# Patient Record
Sex: Male | Born: 2000 | Race: White | Hispanic: No | Marital: Single | Smoking: Current every day smoker
Health system: Southern US, Community
[De-identification: ages and names within clinical notes are randomized; demographics above are authoritative.]

## PROBLEM LIST (undated history)

## (undated) DIAGNOSIS — J45909 Unspecified asthma, uncomplicated: Secondary | ICD-10-CM

## (undated) DIAGNOSIS — F909 Attention-deficit hyperactivity disorder, unspecified type: Secondary | ICD-10-CM

---
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
## 2021-12-27 ENCOUNTER — Encounter (HOSPITAL_COMMUNITY): Payer: Self-pay | Admitting: Emergency Medicine

## 2021-12-27 ENCOUNTER — Other Ambulatory Visit: Payer: Self-pay

## 2021-12-27 ENCOUNTER — Emergency Department (HOSPITAL_COMMUNITY): Payer: Medicaid Other

## 2021-12-27 ENCOUNTER — Emergency Department (HOSPITAL_COMMUNITY)
Admission: EM | Admit: 2021-12-27 | Discharge: 2021-12-27 | Disposition: A | Discharge status: Home / Self Care | Payer: Medicaid Other | Attending: Emergency Medicine | Admitting: Emergency Medicine

## 2021-12-27 DIAGNOSIS — W503XXA Accidental bite by another person, initial encounter: Secondary | ICD-10-CM

## 2021-12-27 DIAGNOSIS — S61411A Laceration without foreign body of right hand, initial encounter: Secondary | ICD-10-CM | POA: Insufficient documentation

## 2021-12-27 DIAGNOSIS — S6991XA Unspecified injury of right wrist, hand and finger(s), initial encounter: Secondary | ICD-10-CM

## 2021-12-27 DIAGNOSIS — Z23 Encounter for immunization: Secondary | ICD-10-CM | POA: Insufficient documentation

## 2021-12-27 DIAGNOSIS — M79641 Pain in right hand: Secondary | ICD-10-CM | POA: Diagnosis present

## 2021-12-27 MED ORDER — TETANUS-DIPHTH-ACELL PERTUSSIS 5-2.5-18.5 LF-MCG/0.5 IM SUSY
0.5000 mL | PREFILLED_SYRINGE | Freq: Once | INTRAMUSCULAR | Status: AC
  Administered 2021-12-27: 0.5 mL via INTRAMUSCULAR
  Filled 2021-12-27: qty 0.5

## 2021-12-27 MED ORDER — AMOXICILLIN-POT CLAVULANATE 875-125 MG PO TABS
1.0000 | ORAL_TABLET | Freq: Once | ORAL | Status: AC
  Administered 2021-12-27: 1 via ORAL
  Filled 2021-12-27: qty 1

## 2021-12-27 NOTE — Discharge Instructions (Addendum)
You were seen in the emergency department today for your hand injury.  As we discussed your x-ray did not show any broken or dislocated bones.  I am placing you on antibiotics to prevent infection from the break in your skin on the knuckle.   Please use Tylenol or ibuprofen for pain.  You may use 800 mg ibuprofen every 6 hours or 1000 mg of Tylenol every 6 hours.  You may choose to alternate between the two, this would be most effective. Do not exceed 4000 mg of Tylenol within 24 hours.  Do not exceed 3200 mg ibuprofen within 24 hours.  Use ice as needed for swelling.  I've attached the contact information for an orthopedic doctor you can follow up with if your symptoms persist.   Continue to monitor how you're doing and return to the ER for new or worsening symptoms.
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
## 2022-06-02 ENCOUNTER — Encounter (HOSPITAL_COMMUNITY): Payer: Self-pay

## 2022-06-02 ENCOUNTER — Other Ambulatory Visit: Payer: Self-pay

## 2022-06-02 ENCOUNTER — Emergency Department (HOSPITAL_COMMUNITY): Payer: Self-pay

## 2022-06-02 ENCOUNTER — Emergency Department (HOSPITAL_COMMUNITY)
Admission: EM | Admit: 2022-06-02 | Discharge: 2022-06-02 | Disposition: A | Discharge status: Home / Self Care | Payer: Self-pay | Attending: Emergency Medicine | Admitting: Emergency Medicine

## 2022-06-02 DIAGNOSIS — K645 Perianal venous thrombosis: Secondary | ICD-10-CM | POA: Insufficient documentation

## 2022-06-02 DIAGNOSIS — R1030 Lower abdominal pain, unspecified: Secondary | ICD-10-CM | POA: Insufficient documentation

## 2022-06-02 DIAGNOSIS — R748 Abnormal levels of other serum enzymes: Secondary | ICD-10-CM | POA: Insufficient documentation

## 2022-06-02 DIAGNOSIS — G8929 Other chronic pain: Secondary | ICD-10-CM | POA: Insufficient documentation

## 2022-06-02 HISTORY — DX: Attention-deficit hyperactivity disorder, unspecified type: F90.9

## 2022-06-02 HISTORY — DX: Unspecified asthma, uncomplicated: J45.909

## 2022-06-02 LAB — COMPREHENSIVE METABOLIC PANEL
ALT: 32 U/L (ref 0–44)
AST: 24 U/L (ref 15–41)
Albumin: 4.6 g/dL (ref 3.5–5.0)
Alkaline Phosphatase: 60 U/L (ref 38–126)
Anion gap: 8 (ref 5–15)
BUN: 14 mg/dL (ref 6–20)
CO2: 27 mmol/L (ref 22–32)
Calcium: 9.3 mg/dL (ref 8.9–10.3)
Chloride: 104 mmol/L (ref 98–111)
Creatinine, Ser: 1.26 mg/dL — ABNORMAL HIGH (ref 0.61–1.24)
GFR, Estimated: >60 mL/min (ref >60)
Glucose, Bld: 91 mg/dL (ref 70–99)
Potassium: 4 mmol/L (ref 3.5–5.1)
Sodium: 139 mmol/L (ref 135–145)
Total Bilirubin: 0.9 mg/dL (ref 0.3–1.2)
Total Protein: 7.9 g/dL (ref 6.5–8.1)

## 2022-06-02 LAB — CBC WITH DIFFERENTIAL/PLATELET
Abs Immature Granulocytes: 0.01 10*3/uL (ref 0.00–0.07)
Basophils Absolute: 0.1 10*3/uL (ref 0.0–0.1)
Basophils Relative: 1 %
Eosinophils Absolute: 0.2 10*3/uL (ref 0.0–0.5)
Eosinophils Relative: 3 %
HCT: 46 % (ref 39.0–52.0)
Hemoglobin: 15.4 g/dL (ref 13.0–17.0)
Immature Granulocytes: 0 %
Lymphocytes Relative: 38 %
Lymphs Abs: 3.3 10*3/uL (ref 0.7–4.0)
MCH: 28.2 pg (ref 26.0–34.0)
MCHC: 33.5 g/dL (ref 30.0–36.0)
MCV: 84.1 fL (ref 80.0–100.0)
Monocytes Absolute: 0.5 10*3/uL (ref 0.1–1.0)
Monocytes Relative: 6 %
Neutro Abs: 4.6 10*3/uL (ref 1.7–7.7)
Neutrophils Relative %: 52 %
Platelets: 367 10*3/uL (ref 150–400)
RBC: 5.47 MIL/uL (ref 4.22–5.81)
RDW: 12.9 % (ref 11.5–15.5)
WBC: 8.7 10*3/uL (ref 4.0–10.5)
nRBC: 0 % (ref 0.0–0.2)

## 2022-06-02 LAB — LIPASE, BLOOD: Lipase: 55 U/L — ABNORMAL HIGH (ref 11–51)

## 2022-06-02 MED ORDER — HYDROCORTISONE ACETATE 25 MG RE SUPP: 25.0000 mg | Freq: Two times a day (BID) | RECTAL | 0 refills | Status: AC

## 2022-06-02 MED ORDER — IOHEXOL 300 MG/ML  SOLN
100.0000 mL | Freq: Once | INTRAMUSCULAR | Status: AC | PRN
  Administered 2022-06-02: 100 mL via INTRAVENOUS

## 2022-06-02 NOTE — ED Notes (Signed)
ED Provider at bedside. 

## 2022-06-02 NOTE — ED Triage Notes (Signed)
Pt arrived via POV c/o RUQ/RLQ pain for over a week. Pt endorses hemorrhoids, diarrhea, and notices blood in stool. Pt does report quitting ETOH X 3 months, does have recent episodes of emesis, mild GERD and reports taking OTC medication for heart burn. Pain is not reproducible to palpitation.

## 2022-06-02 NOTE — ED Provider Notes (Signed)
[place] EMERGENCY DEPARTMENT AT [facility] Provider Note   CSN: [id number] Arrival date & time: 06/02/22  0023     History  Chief Complaint  Patient presents with   Abdominal Pain    Jackson Horton is a 22 y.o. male.  The history is provided by the patient.  Abdominal Pain He has history of alcohol abuse and states that he did start drinking about 3 months ago.  When he stopped drinking, he noted that he was having pain in his right mid and lower abdomen.  Pain waxes and wanes but has in general been getting worse.  There is associated nausea and vomiting.  He states he does not know if he was in pain before he stopped drinking because he stayed drunk all the time.  He is also complaining of hemorrhoids which have been chronic but have been getting worse.   Home Medications Prior to Admission medications   Medication Sig Start Date End Date Taking? Authorizing Provider  albuterol (PROVENTIL HFA;VENTOLIN HFA) 108 (90 Base) MCG/ACT inhaler Inhale 2 puffs into the lungs every 4 (four) hours as needed for wheezing or shortness of breath. 02/23/15   Babs Sciara, MD  albuterol (PROVENTIL HFA;VENTOLIN HFA) 108 (90 Base) MCG/ACT inhaler Inhale 2 puffs into the lungs every 6 (six) hours as needed for wheezing. 05/10/15   Babs Sciara, MD  amphetamine-dextroamphetamine (ADDERALL XR) 20 MG 24 hr capsule Take 2 capsules (40 mg total) by mouth every morning. 05/15/15   Babs Sciara, MD  azithromycin (ZITHROMAX Z-PAK) 250 MG tablet Take 2 tablets (500 mg) on  Day 1,  followed by 1 tablet (250 mg) once daily on Days 2 through 5. 05/10/15   Luking, Jonna Coup, MD  predniSONE (DELTASONE) 20 MG tablet 3qd for 2d then 2qd for 2d then 1qd for 2d 05/10/15   Babs Sciara, MD      Allergies    Patient has no known allergies.    Review of Systems   Review of Systems  Gastrointestinal:  Positive for abdominal pain.  All other systems reviewed and are negative.   Physical  Exam Updated Vital Signs BP 115/67   Pulse 63   Temp 97.9 F (36.6 C) (Oral)   Resp 18   Ht 5\' 8"  (1.727 m)   Wt 99.8 kg   SpO2 96%   BMI 33.45 kg/m  Physical Exam Vitals and nursing note reviewed.   22 year old male, resting comfortably and in no acute distress. Vital signs are normal. Oxygen saturation is 96%, which is normal. Head is normocephalic and atraumatic. PERRLA, EOMI. Oropharynx is clear. Neck is nontender and supple without adenopathy or JVD. Back is nontender and there is no CVA tenderness. Lungs are clear without rales, wheezes, or rhonchi. Chest is nontender. Heart has regular rate and rhythm without murmur. Abdomen is soft, flat, with tenderness fairly well localized to the right mid abdomen.  There is negative Murphy sign.  There is no rebound or guarding. Rectal: Thrombosed external hemorrhoids present on the right Extremities have no cyanosis or edema, full range of motion is present. Skin is warm and dry without rash. Neurologic: Mental status is normal, cranial nerves are intact, moves all extremities equally.  ED Results / Procedures / Treatments   Labs (all labs ordered are listed, but only abnormal results are displayed) Labs Reviewed  COMPREHENSIVE METABOLIC PANEL - Abnormal; Notable for the following components:      Result Value  Creatinine, Ser 1.26 (*)    All other components within normal limits  LIPASE, BLOOD - Abnormal; Notable for the following components:   Lipase 55 (*)    All other components within normal limits  CBC WITH DIFFERENTIAL/PLATELET   Radiology CT ABDOMEN PELVIS W CONTRAST  Result Date: 06/02/2022 CLINICAL DATA:  Right upper and lower quadrant abdominal pain for over a week. Hemorrhoids with diarrhea and blood in stool. EXAM: CT ABDOMEN AND PELVIS WITH CONTRAST TECHNIQUE: Multidetector CT imaging of the abdomen and pelvis was performed using the standard protocol following bolus administration of intravenous contrast.  RADIATION DOSE REDUCTION: This exam was performed according to the departmental dose-optimization program which includes automated exposure control, adjustment of the mA and/or kV according to patient size and/or use of iterative reconstruction technique. CONTRAST:  OMNIPAQUE IOHEXOL 300 MG/ML  SOLN COMPARISON:  None Available. FINDINGS: Lower chest: No acute abnormality. Hepatobiliary: Subcentimeter hypodensities are present in the liver which statistically most likely represent cysts or hemangiomas. There is focal fatty infiltration in the left lobe of the liver adjacent to the falciform ligament. No biliary ductal dilatation. The gallbladder is without stones. Pancreas: Unremarkable. No pancreatic ductal dilatation or surrounding inflammatory changes. Spleen: Normal in size without focal abnormality. Adrenals/Urinary Tract: The adrenal glands are within normal limits. The kidneys enhance symmetrically. No renal calculus or hydronephrosis. The bladder is unremarkable. Stomach/Bowel: Stomach is within normal limits. Appendix appears normal. No evidence of bowel wall thickening, distention, or inflammatory changes. No free air or pneumatosis. Vascular/Lymphatic: No significant vascular findings are present. No enlarged abdominal or pelvic lymph nodes. Reproductive: Prostate is unremarkable. Other: No abdominal wall hernia or abnormality. No abdominopelvic ascites. Musculoskeletal: No acute osseous abnormality. IMPRESSION: No acute intra-abdominal process. Electronically Signed   By: Thornell Sartorius M.D.   On: 06/02/2022 02:47    Procedures Procedures    Medications Ordered in ED Medications - No data to display  ED Course/ Medical Decision Making/ A&P                             Medical Decision Making Amount and/or Complexity of Data Reviewed Labs: ordered. Radiology: ordered.  Risk Prescription drug management.   Right mid and lower abdominal pain.  Differential diagnosis includes, but is  not limited to, appendicitis, diverticulitis, cholecystitis, pancreatitis, irritable bowel syndrome.  Thrombosed external hemorrhoids.  I have reviewed and interpreted his laboratory test, and my interpretation is mild elevation of creatinine of uncertain clinical significance, normal transaminases and alkaline phosphatase and bilirubin, normal CBC, mild elevation of lipase of uncertain clinical significance.  I have ordered a CT of abdomen and pelvis to rule out chronic appendicitis.  CT scan shows no acute process.  Have independently viewed the images, and agree with the radiologist's interpretation.  I am discharging him with a prescription for Anusol HC suppositories, recommend to use fiber supplements as needed and polyethylene glycol as needed.  Final Clinical Impression(s) / ED Diagnoses Final diagnoses:  Chronic abdominal pain  Thrombosed external hemorrhoids  Elevated lipase    Rx / DC Orders ED Discharge Orders          Ordered    hydrocortisone (ANUSOL-HC) 25 MG suppository  2 times daily        06/02/22 0303              Dione Booze, MD 06/02/22 [phone]

## 2022-06-02 NOTE — Discharge Instructions (Addendum)
You may take ibuprofen and/or acetaminophen as needed for pain.  For your hemorrhoids, use sitz bath's several times a day.  To prevent the hemorrhoids from coming back, it is very important that you avoid constipation.  Make sure to get enough fiber in your diet.  If you are not getting enough fiber, use fiber supplements.  You may take MiraLAX as needed to treat constipation.  Never, ever consume any alcohol.
